# Patient Record
Sex: Female | Born: 1940 | Race: White | Hispanic: No
Health system: Southern US, Community
[De-identification: ages and names within clinical notes are randomized; demographics above are authoritative.]

---

## 1998-05-10 ENCOUNTER — Other Ambulatory Visit: Admission: RE | Admit: 1998-05-10 | Discharge: 1998-05-10 | Payer: Self-pay | Admitting: *Deleted

## 1999-05-30 ENCOUNTER — Other Ambulatory Visit: Admission: RE | Admit: 1999-05-30 | Discharge: 1999-05-30 | Payer: Self-pay | Admitting: *Deleted

## 2000-03-25 ENCOUNTER — Other Ambulatory Visit: Admission: RE | Admit: 2000-03-25 | Discharge: 2000-03-25 | Payer: Self-pay | Admitting: Family Medicine

## 2001-11-06 ENCOUNTER — Other Ambulatory Visit: Admission: RE | Admit: 2001-11-06 | Discharge: 2001-11-06 | Payer: Self-pay | Admitting: *Deleted

## 2002-12-30 ENCOUNTER — Other Ambulatory Visit: Admission: RE | Admit: 2002-12-30 | Discharge: 2002-12-30 | Payer: Self-pay | Admitting: *Deleted

## 2004-01-24 ENCOUNTER — Other Ambulatory Visit: Admission: RE | Admit: 2004-01-24 | Discharge: 2004-01-24 | Payer: Self-pay | Admitting: *Deleted

## 2005-03-08 ENCOUNTER — Other Ambulatory Visit: Admission: RE | Admit: 2005-03-08 | Discharge: 2005-03-08 | Payer: Self-pay | Admitting: *Deleted

## 2006-05-01 ENCOUNTER — Other Ambulatory Visit: Admission: RE | Admit: 2006-05-01 | Discharge: 2006-05-01 | Payer: Self-pay | Admitting: *Deleted

## 2007-07-23 ENCOUNTER — Other Ambulatory Visit: Admission: RE | Admit: 2007-07-23 | Discharge: 2007-07-23 | Payer: Self-pay | Admitting: *Deleted

## 2020-04-14 DIAGNOSIS — E039 Hypothyroidism, unspecified: Secondary | ICD-10-CM | POA: Diagnosis not present

## 2020-04-14 DIAGNOSIS — E871 Hypo-osmolality and hyponatremia: Secondary | ICD-10-CM | POA: Diagnosis not present

## 2020-04-14 DIAGNOSIS — R739 Hyperglycemia, unspecified: Secondary | ICD-10-CM | POA: Diagnosis not present

## 2020-04-14 DIAGNOSIS — E785 Hyperlipidemia, unspecified: Secondary | ICD-10-CM | POA: Diagnosis not present

## 2020-04-14 DIAGNOSIS — I10 Essential (primary) hypertension: Secondary | ICD-10-CM | POA: Diagnosis not present

## 2020-04-26 DIAGNOSIS — H7311 Chronic myringitis, right ear: Secondary | ICD-10-CM | POA: Diagnosis not present

## 2020-04-26 DIAGNOSIS — H6091 Unspecified otitis externa, right ear: Secondary | ICD-10-CM | POA: Diagnosis not present

## 2020-04-26 DIAGNOSIS — H6121 Impacted cerumen, right ear: Secondary | ICD-10-CM | POA: Diagnosis not present

## 2020-04-26 DIAGNOSIS — H9201 Otalgia, right ear: Secondary | ICD-10-CM | POA: Diagnosis not present

## 2020-05-03 DIAGNOSIS — R29898 Other symptoms and signs involving the musculoskeletal system: Secondary | ICD-10-CM | POA: Diagnosis not present

## 2020-05-03 DIAGNOSIS — I10 Essential (primary) hypertension: Secondary | ICD-10-CM | POA: Diagnosis not present

## 2020-05-03 DIAGNOSIS — E871 Hypo-osmolality and hyponatremia: Secondary | ICD-10-CM | POA: Diagnosis not present

## 2020-05-04 DIAGNOSIS — Z8669 Personal history of other diseases of the nervous system and sense organs: Secondary | ICD-10-CM | POA: Diagnosis not present

## 2020-05-04 DIAGNOSIS — H6091 Unspecified otitis externa, right ear: Secondary | ICD-10-CM | POA: Diagnosis not present

## 2020-05-23 DIAGNOSIS — I10 Essential (primary) hypertension: Secondary | ICD-10-CM | POA: Diagnosis not present

## 2020-05-23 DIAGNOSIS — E871 Hypo-osmolality and hyponatremia: Secondary | ICD-10-CM | POA: Diagnosis not present

## 2020-06-06 DIAGNOSIS — Z0000 Encounter for general adult medical examination without abnormal findings: Secondary | ICD-10-CM | POA: Diagnosis not present

## 2020-06-06 DIAGNOSIS — E785 Hyperlipidemia, unspecified: Secondary | ICD-10-CM | POA: Diagnosis not present

## 2020-06-06 DIAGNOSIS — Z139 Encounter for screening, unspecified: Secondary | ICD-10-CM | POA: Diagnosis not present

## 2020-07-04 DIAGNOSIS — I10 Essential (primary) hypertension: Secondary | ICD-10-CM | POA: Diagnosis not present

## 2020-08-15 DIAGNOSIS — E039 Hypothyroidism, unspecified: Secondary | ICD-10-CM | POA: Diagnosis not present

## 2020-08-15 DIAGNOSIS — I10 Essential (primary) hypertension: Secondary | ICD-10-CM | POA: Diagnosis not present

## 2020-08-15 DIAGNOSIS — E785 Hyperlipidemia, unspecified: Secondary | ICD-10-CM | POA: Diagnosis not present

## 2021-11-11 DIAGNOSIS — I517 Cardiomegaly: Secondary | ICD-10-CM | POA: Diagnosis not present

## 2021-11-12 DIAGNOSIS — I4891 Unspecified atrial fibrillation: Secondary | ICD-10-CM | POA: Diagnosis not present

## 2021-11-14 DIAGNOSIS — I480 Paroxysmal atrial fibrillation: Secondary | ICD-10-CM | POA: Insufficient documentation

## 2021-11-14 DIAGNOSIS — I63419 Cerebral infarction due to embolism of unspecified middle cerebral artery: Secondary | ICD-10-CM | POA: Insufficient documentation

## 2021-11-24 ENCOUNTER — Emergency Department (HOSPITAL_COMMUNITY): Payer: Medicare Other

## 2021-11-24 ENCOUNTER — Emergency Department (HOSPITAL_COMMUNITY)
Admission: EM | Admit: 2021-11-24 | Discharge: 2021-11-24 | Disposition: A | Discharge status: Skilled Nursing Facility | Payer: Medicare Other | Attending: Emergency Medicine | Admitting: Emergency Medicine

## 2021-11-24 DIAGNOSIS — N3090 Cystitis, unspecified without hematuria: Secondary | ICD-10-CM | POA: Diagnosis not present

## 2021-11-24 DIAGNOSIS — M542 Cervicalgia: Secondary | ICD-10-CM | POA: Insufficient documentation

## 2021-11-24 DIAGNOSIS — I10 Essential (primary) hypertension: Secondary | ICD-10-CM | POA: Diagnosis not present

## 2021-11-24 DIAGNOSIS — I4891 Unspecified atrial fibrillation: Secondary | ICD-10-CM | POA: Diagnosis not present

## 2021-11-24 DIAGNOSIS — E039 Hypothyroidism, unspecified: Secondary | ICD-10-CM | POA: Diagnosis not present

## 2021-11-24 DIAGNOSIS — I639 Cerebral infarction, unspecified: Secondary | ICD-10-CM | POA: Diagnosis not present

## 2021-11-24 DIAGNOSIS — Z20822 Contact with and (suspected) exposure to covid-19: Secondary | ICD-10-CM | POA: Insufficient documentation

## 2021-11-24 DIAGNOSIS — R4182 Altered mental status, unspecified: Secondary | ICD-10-CM | POA: Insufficient documentation

## 2021-11-24 LAB — CBC WITH DIFFERENTIAL/PLATELET
Abs Immature Granulocytes: 0.07 10*3/uL (ref 0.00–0.07)
Basophils Absolute: 0.1 10*3/uL (ref 0.0–0.1)
Basophils Relative: 0 %
Eosinophils Absolute: 0 10*3/uL (ref 0.0–0.5)
Eosinophils Relative: 0 %
HCT: 34.4 % — ABNORMAL LOW (ref 36.0–46.0)
Hemoglobin: 11.7 g/dL — ABNORMAL LOW (ref 12.0–15.0)
Immature Granulocytes: 1 %
Lymphocytes Relative: 11 %
Lymphs Abs: 1.6 10*3/uL (ref 0.7–4.0)
MCH: 33.5 pg (ref 26.0–34.0)
MCHC: 34 g/dL (ref 30.0–36.0)
MCV: 98.6 fL (ref 80.0–100.0)
Monocytes Absolute: 1.8 10*3/uL — ABNORMAL HIGH (ref 0.1–1.0)
Monocytes Relative: 12 %
Neutro Abs: 11.4 10*3/uL — ABNORMAL HIGH (ref 1.7–7.7)
Neutrophils Relative %: 76 %
Platelets: 442 10*3/uL — ABNORMAL HIGH (ref 150–400)
RBC: 3.49 MIL/uL — ABNORMAL LOW (ref 3.87–5.11)
RDW: 12.7 % (ref 11.5–15.5)
WBC: 15 10*3/uL — ABNORMAL HIGH (ref 4.0–10.5)
nRBC: 0 % (ref 0.0–0.2)

## 2021-11-24 LAB — RESP PANEL BY RT-PCR (FLU A&B, COVID) ARPGX2
Influenza A by PCR: NEGATIVE
Influenza B by PCR: NEGATIVE
SARS Coronavirus 2 by RT PCR: NEGATIVE

## 2021-11-24 LAB — COMPREHENSIVE METABOLIC PANEL
ALT: 16 U/L (ref 0–44)
AST: 18 U/L (ref 15–41)
Albumin: 2.8 g/dL — ABNORMAL LOW (ref 3.5–5.0)
Alkaline Phosphatase: 43 U/L (ref 38–126)
Anion gap: 8 (ref 5–15)
BUN: 6 mg/dL — ABNORMAL LOW (ref 8–23)
CO2: 23 mmol/L (ref 22–32)
Calcium: 8.5 mg/dL — ABNORMAL LOW (ref 8.9–10.3)
Chloride: 99 mmol/L (ref 98–111)
Creatinine, Ser: 0.7 mg/dL (ref 0.44–1.00)
GFR, Estimated: >60 mL/min (ref >60)
Glucose, Bld: 119 mg/dL — ABNORMAL HIGH (ref 70–99)
Potassium: 3.2 mmol/L — ABNORMAL LOW (ref 3.5–5.1)
Sodium: 130 mmol/L — ABNORMAL LOW (ref 135–145)
Total Bilirubin: 0.8 mg/dL (ref 0.3–1.2)
Total Protein: 6.6 g/dL (ref 6.5–8.1)

## 2021-11-24 LAB — PROTIME-INR
INR: 1.1 (ref 0.8–1.2)
Prothrombin Time: 13.9 seconds (ref 11.4–15.2)

## 2021-11-24 LAB — URINALYSIS, ROUTINE W REFLEX MICROSCOPIC
Bilirubin Urine: NEGATIVE
Glucose, UA: NEGATIVE mg/dL
Hgb urine dipstick: NEGATIVE
Ketones, ur: 5 mg/dL — AB
Nitrite: NEGATIVE
Protein, ur: NEGATIVE mg/dL
Specific Gravity, Urine: 1.015 (ref 1.005–1.030)
pH: 6 (ref 5.0–8.0)

## 2021-11-24 LAB — TSH: TSH: 2.768 u[IU]/mL (ref 0.350–4.500)

## 2021-11-24 MED ORDER — LACTATED RINGERS IV BOLUS: 1000.0000 mL | Freq: Once | INTRAVENOUS | Status: DC

## 2021-11-24 MED ORDER — SODIUM CHLORIDE 0.9 % IV SOLN
Freq: Once | INTRAVENOUS | Status: AC
Start: 2021-11-24 — End: 2021-11-24

## 2021-11-24 MED ORDER — ACETAMINOPHEN 325 MG PO TABS
650.0000 mg | ORAL_TABLET | Freq: Once | ORAL | Status: AC
  Administered 2021-11-24: 650 mg via ORAL
  Filled 2021-11-24: qty 2

## 2021-11-24 MED ORDER — CEFTRIAXONE SODIUM 1 G IJ SOLR
1.0000 g | Freq: Once | INTRAMUSCULAR | Status: AC
  Administered 2021-11-24: 1 g via INTRAVENOUS
  Filled 2021-11-24: qty 10

## 2021-11-24 MED ORDER — CEPHALEXIN 500 MG PO CAPS: 500.0000 mg | ORAL_CAPSULE | Freq: Two times a day (BID) | ORAL | 0 refills | Status: DC

## 2021-11-24 NOTE — ED Notes (Signed)
[facility] to be called for patient  

## 2021-11-24 NOTE — ED Notes (Addendum)
Patient transported to CT 

## 2021-11-24 NOTE — ED Provider Notes (Signed)
[facility] EMERGENCY DEPARTMENT Provider Note   CSN: [id number] Arrival date & time: 11/24/21  1613     History  No chief complaint on file.   Katherine Newman is a 81 y.o. female.  HPI This is a 81 year old female with a past medical history significant for hypertension, arthritis, hypothyroidism, atrial fibrillation, and ischemic stroke with hemorrhagic conversion who presents to emergency department for evaluation of altered mental status.  History is obtained from the patient as well as EMS and son via telephone.  Reports that the patient was brought to our emergency department from her skilled nursing facility.  There the patient has had a precipitous decline in her functional status.  They state that over the past two days the patient has become progressively more confused and has had difficulty with her ambulation specifically transitioning to her walker.  According to family members this is similar to her presentation where her initial ischemic stroke was found in december.  The patient has had no trauma and has had no infectious symptoms such as fever, chills, shortness of breath, chest pain, dysuria, vomiting, diarrhea, or new rashes.  The patient has had reported decreased p.o. intake.  The time of my evaluation the patient is alert and oriented to herself only.  Patient is aware that we are in the emergency department but is unsure which hospital and is unsure of the date.     Home Medications Prior to Admission medications   Medication Sig Start Date End Date Taking? Authorizing Provider  atorvastatin (LIPITOR) 80 MG tablet Take 80 mg by mouth daily.   Yes [provider]  cephALEXin (KEFLEX) 500 MG capsule Take 1 capsule (500 mg total) by mouth 2 (two) times daily. 11/24/21  Yes Zachery Dakins, MD  Cholecalciferol (VITAMIN D) 50 MCG (2000 UT) CAPS Take 2,000 Units by mouth daily.   Yes [provider]  Cyanocobalamin 2500 MCG SUBL  Place 2,500 mcg under the tongue daily.   Yes [provider]  diltiazem (CARDIZEM CD) 120 MG 24 hr capsule Take 120 mg by mouth daily.   Yes [provider]  lisinopril (ZESTRIL) 10 MG tablet Take 10 mg by mouth daily. 10/19/21  Yes [provider]  metoprolol tartrate (LOPRESSOR) 100 MG tablet Take 100 mg by mouth 2 (two) times daily.   Yes [provider]      Allergies    Patient has no known allergies.    Review of Systems   Review of Systems  All other systems reviewed and are negative.  Physical Exam Updated Vital Signs BP (!) 154/95    Pulse 84    Temp 99.2 F (37.3 C) (Oral)    Resp 16    SpO2 98%  Physical Exam Vitals and nursing note reviewed.  Constitutional:      General: She is not in acute distress.    Appearance: She is well-developed.     Comments: Patient resting in bed with neck tilted to the side. Patient states that she has pain to the right cervical paraspinal muscles.  Patient is able to range her neck.  HENT:     Head: Normocephalic and atraumatic.     Right Ear: External ear normal.     Left Ear: External ear normal.  Eyes:     Conjunctiva/sclera: Conjunctivae normal.  Cardiovascular:     Rate and Rhythm: Normal rate and regular rhythm.     Heart sounds: No murmur heard. Pulmonary:  Effort: Pulmonary effort is normal. No respiratory distress.     Breath sounds: Normal breath sounds.  Abdominal:     Palpations: Abdomen is soft.     Tenderness: There is no abdominal tenderness.  Musculoskeletal:        General: No swelling.     Cervical back: Neck supple. Tenderness present. No rigidity.  Skin:    General: Skin is warm and dry.     Capillary Refill: Capillary refill takes less than 2 seconds.  Neurological:     Mental Status: She is alert. Mental status is at baseline. She is disoriented.     Cranial Nerves: No cranial nerve deficit.     Sensory: No sensory deficit.     Motor: No weakness.  Psychiatric:         Mood and Affect: Mood normal.    ED Results / Procedures / Treatments   Labs (all labs ordered are listed, but only abnormal results are displayed) Labs Reviewed  CBC WITH DIFFERENTIAL/PLATELET - Abnormal; Notable for the following components:      Result Value   WBC 15.0 (*)    RBC 3.49 (*)    Hemoglobin 11.7 (*)    HCT 34.4 (*)    Platelets 442 (*)    Neutro Abs 11.4 (*)    Monocytes Absolute 1.8 (*)    All other components within normal limits  COMPREHENSIVE METABOLIC PANEL - Abnormal; Notable for the following components:   Sodium 130 (*)    Potassium 3.2 (*)    Glucose, Bld 119 (*)    BUN 6 (*)    Calcium 8.5 (*)    Albumin 2.8 (*)    All other components within normal limits  URINALYSIS, ROUTINE W REFLEX MICROSCOPIC - Abnormal; Notable for the following components:   APPearance HAZY (*)    Ketones, ur 5 (*)    Leukocytes,Ua MODERATE (*)    Bacteria, UA FEW (*)    All other components within normal limits  RESP PANEL BY RT-PCR (FLU A&B, COVID) ARPGX2  URINE CULTURE  TSH  PROTIME-INR    EKG EKG Interpretation  Date/Time:  Friday November 24 2021 17:08:11 EST Ventricular Rate:  85 PR Interval:    QRS Duration: 97 QT Interval:  386 QTC Calculation: 459 R Axis:   -28 Text Interpretation: Atrial fibrillation Abnormal R-wave progression, early transition LVH with secondary repolarization abnormality rate is slower compared to earlier in the day Confirmed by Sherwood Gambler [phone]) on 11/24/2021 9:19:25 PM  Radiology CT Head Wo Contrast  Result Date: 11/24/2021 CLINICAL DATA:  Mental status change, unknown cause hx of hemorrhagic stroke EXAM: CT HEAD WITHOUT CONTRAST TECHNIQUE: Contiguous axial images were obtained from the base of the skull through the vertex without intravenous contrast. RADIATION DOSE REDUCTION: This exam was performed according to the departmental dose-optimization program which includes automated exposure control, adjustment of the mA  and/or kV according to patient size and/or use of iterative reconstruction technique. COMPARISON:  11/14/2021 FINDINGS: Brain: Evolutionary changes in the previous we seen right basal ganglia infarction with hemorrhage. No new infarct or hemorrhage. No hydrocephalus. There is atrophy and chronic small vessel disease changes. Vascular: No hyperdense vessel or unexpected calcification. Skull: No acute calvarial abnormality. Sinuses/Orbits: No acute findings Other: None IMPRESSION: Evolutionary changes in the previously seen right basal ganglia infarct/hemorrhage. Atrophy, chronic microvascular disease. No acute intracranial abnormality. Electronically Signed   By: Rolm Baptise M.D.   On: 11/24/2021 19:02   DG Chest Portable 1  View  Result Date: 11/24/2021 CLINICAL DATA:  Concern for pneumonia EXAM: PORTABLE CHEST 1 VIEW COMPARISON:  11/11/2021 FINDINGS: Heart size and vascularity normal. Lungs are clear and free of infiltrate or effusion. Apical pleural scarring bilaterally. No acute skeletal abnormality. IMPRESSION: No active disease. Electronically Signed   By: Franchot Gallo M.D.   On: 11/24/2021 17:13    Procedures Procedures    Medications Ordered in ED Medications  cefTRIAXone (ROCEPHIN) 1 g in sodium chloride 0.9 % 100 mL IVPB (0 g Intravenous Stopped 11/24/21 2052)  0.9 %  sodium chloride infusion (0 mLs Intravenous Stopped 11/24/21 2117)  acetaminophen (TYLENOL) tablet 650 mg (650 mg Oral Given 11/24/21 2020)    ED Course/ Medical Decision Making/ A&P                           Medical Decision Making Amount and/or Complexity of Data Reviewed Labs: ordered. Radiology: ordered.  Risk OTC drugs. Prescription drug management.   This is a 81 year old female who presents to emergency department for evaluation of altered mental status.  The patient is at risk for acute life-threatening illness in the form of hemorrhagic stroke and her recent hemorrhagic stroke.  Differential diagnosis  includes but is not limited to the following: Hemorrhagic stroke, ischemic stroke, electrolyte abnormality, hypothyroidism, urinary tract infection, pneumonia, viral infection.  CT head was obtained at arrival and reviewed independently.  I agree with the radiology interpretation that her hemorrhagic conversion is stable.  Lab work-up was obtained which did reveal a leukocytosis of 15.0 with urine concerning for cystitis.  The patient also had a sodium of 130 and potassium 3.2 creatinine was at baseline.  The patient was not overtly febrile while in the emergency department however her rectal temp was greater than 100.  I suspect that the patient's encephalopathy is secondary to urinary tract infection.  We will administer 1 L of fluids and 1 g of Rocephin here in the emergency department we will also obtain urine culture and discharge the patient home on Keflex.  This plan was discussed with the patient's son over the telephone and I was able to tell him the patient's exam at the time of our conversation.  He agreed that he felt to being discharged to her nursing facility was a reasonable plan.  Also gave return precautions that she should return if she did not have improvement of her altered mental status in the next few days.  Final Clinical Impression(s) / ED Diagnoses Final diagnoses:  Altered mental status, unspecified altered mental status type  Cystitis    Rx / DC Orders ED Discharge Orders          Ordered    cephALEXin (KEFLEX) 500 MG capsule  2 times daily        11/24/21 2127              Zachery Dakins, MD 11/24/21 2154    Sherwood Gambler, MD 11/27/21 [phone]

## 2021-11-24 NOTE — ED Notes (Signed)
Report given to [facility] nursing facility

## 2021-11-24 NOTE — Discharge Instructions (Signed)
Ms Rosier was seen and evaluated in our emergency department for altered mental status.  Imaging of her head was unremarkable.  Lab work-up was significant for findings consistent with urinary tract infection.  Patient was discharged home on course of Keflex.  Recommend follow-up with PCP for monitoring of resolution of altered abscess.  Care discussed with the patient's son.

## 2021-11-24 NOTE — ED Triage Notes (Signed)
Pt bib ems from [facility] facility c/o AMS. Pt had CVA with hemorraghic bleed 11/11/2021 at [facility] with no deficits. Pt admitted to [facility] facility 11/18/2021 and staff noticed a decline in mental status 11/20/2021. While son was visiting pt 11/23/2021, pt was cognitively different at 11:00.  Pt was taken off blood thinners. Pt had a CT scan scheduled 11/29/2021 to determine if pt was still bleeding.   Pt currently expressing neck pain upon moving.  BP 160/98 HR 88 (A-fib) CBG 122 RR 16 RA 98%

## 2021-11-24 NOTE — ED Notes (Signed)
Patient transported to CT 

## 2021-11-24 NOTE — ED Notes (Signed)
Pt discharged and taken back to facility via [facility].

## 2021-11-27 LAB — URINE CULTURE: Culture: >=100000 — AB

## 2021-11-28 ENCOUNTER — Telehealth: Payer: Self-pay

## 2021-11-28 DIAGNOSIS — F0150 Vascular dementia without behavioral disturbance: Secondary | ICD-10-CM | POA: Insufficient documentation

## 2021-11-28 NOTE — Telephone Encounter (Signed)
Post ED Visit - Positive Culture Follow-up  Culture report reviewed by antimicrobial stewardship pharmacist: [facility] []  , Pharm.D. [x]  Enzo Bi, Pharm.D., BCPS AQ-ID []  , Pharm.D., BCPS []  Celedonio Miyamoto, Pharm.D., BCPS []  [place]ler, Garvin Fila.D., BCPS, AAHIVP []  , Pharm.D., BCPS, AAHIVP []  Georgina Pillion, PharmD, BCPS []  , PharmD, BCPS []  [place], PharmD, BCPS []  [address], PharmD []  , PharmD, BCPS []  Estella Husk, PharmD  Pharmacy Team []  Lysle Pearl, PharmD []  , PharmD []  Phillips Climes, PharmD []  , Rph []  Agapito Games) , PharmD []  Verlan Friends, PharmD []  , PharmD []  Mervyn Gay, PharmD []  , PharmD []  Vinnie Level, PharmD []  [facility], PharmD []  , PharmD []  Len Childs, PharmD   Positive urine culture Treated with Cephalexin, organism sensitive to the same and no further patient follow-up is required at this time.  11/28/2021, 3:09 PM

## 2021-12-11 ENCOUNTER — Ambulatory Visit: Payer: Medicare Other | Admitting: Neurology

## 2021-12-11 VITALS — BP 118/71 | HR 73

## 2021-12-11 DIAGNOSIS — I6381 Other cerebral infarction due to occlusion or stenosis of small artery: Secondary | ICD-10-CM

## 2021-12-11 DIAGNOSIS — I4819 Other persistent atrial fibrillation: Secondary | ICD-10-CM | POA: Diagnosis not present

## 2021-12-11 DIAGNOSIS — R413 Other amnesia: Secondary | ICD-10-CM | POA: Diagnosis not present

## 2021-12-11 DIAGNOSIS — F01A0 Vascular dementia, mild, without behavioral disturbance, psychotic disturbance, mood disturbance, and anxiety: Secondary | ICD-10-CM

## 2021-12-11 DIAGNOSIS — F01A18 Vascular dementia, mild, with other behavioral disturbance: Secondary | ICD-10-CM | POA: Diagnosis not present

## 2021-12-11 DIAGNOSIS — I480 Paroxysmal atrial fibrillation: Secondary | ICD-10-CM

## 2021-12-11 DIAGNOSIS — I63419 Cerebral infarction due to embolism of unspecified middle cerebral artery: Secondary | ICD-10-CM

## 2021-12-11 MED ORDER — DONEPEZIL HCL 5 MG PO TABS
5.0000 mg | ORAL_TABLET | Freq: Every day | ORAL | 3 refills | Status: AC
Start: 2021-12-11 — End: ?

## 2021-12-11 MED ORDER — APIXABAN 5 MG PO TABS
5.0000 mg | ORAL_TABLET | Freq: Two times a day (BID) | ORAL | 3 refills | Status: AC
Start: 2021-12-11 — End: ?

## 2021-12-11 NOTE — Patient Instructions (Signed)
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  She is also been diagnosed with new onset A-fib and so recommend she start taking Eliquis 5 mg twice daily for stroke prophylaxis as well as maintain aggressive risk factor modification with strict control of hypertension with blood pressure goal below 130/90, lipids with LDL cholesterol goal below 70 mg percent and diabetes with hemoglobin A1c goal below 6.5%.  We will also check dementia panel labs and EEG.  I encouraged her to increase participation in cognitively challenging activities like solving crossword puzzles, playing bridge and sudoku.  I also advised her to use her wheeled walker at all times while walking outdoors and we discussed fall and safety precautions.  She will return for follow-up in the future in 3 months or call earlier if necessary. Fall Prevention in the Home, Adult Falls can cause injuries and can happen to people of all ages. There are many things you can do to make your home safe and to help prevent falls. Ask for help when making these changes. What actions can I take to prevent falls? General Instructions Use good lighting in all rooms. Replace any light bulbs that burn out. Turn on the lights in dark areas. Use night-lights. Keep items that you use often in easy-to-reach places. Lower the shelves around your home if needed. Set up your furniture so you have a clear path. Avoid moving your furniture around. Do not have throw rugs or other things on the floor that can make you trip. Avoid walking on wet floors. If any of your floors are uneven, fix them. Add color or contrast paint or tape to clearly mark and help you see: Grab bars or handrails. First and last steps of staircases. Where the edge of each step is. If you use a stepladder: Make sure that it is fully opened. Do  not climb a closed stepladder. Make sure the sides of the stepladder are locked in place. Ask someone to hold the stepladder while you use it. Know where your pets are when moving through your home. What can I do in the bathroom?   Keep the floor dry. Clean up any water on the floor right away. Remove soap buildup in the tub or shower. Use nonskid mats or decals on the floor of the tub or shower. Attach bath mats securely with double-sided, nonslip rug tape. If you need to sit down in the shower, use a plastic, nonslip stool. Install grab bars by the toilet and in the tub and shower. Do not use towel bars as grab bars. What can I do in the bedroom? Make sure that you have a light by your bed that is easy to reach. Do not use any sheets or blankets for your bed that hang to the floor. Have a firm chair with side arms that you can use for support when you get dressed. What can I do in the kitchen? Clean up any spills right away. If you need to reach something above you, use a step stool with a grab bar. Keep electrical cords out of the way. Do not use floor polish or wax that makes floors slippery. What can I do with my stairs? Do not leave any items on the stairs. Make sure that you have a light switch at the top and the bottom of the stairs. Make sure that there are  handrails on both sides of the stairs. Fix handrails that are broken or loose. Install nonslip stair treads on all your stairs. Avoid having throw rugs at the top or bottom of the stairs. Choose a carpet that does not hide the edge of the steps on the stairs. Check carpeting to make sure that it is firmly attached to the stairs. Fix carpet that is loose or worn. What can I do on the outside of my home? Use bright outdoor lighting. Fix the edges of walkways and driveways and fix any cracks. Remove anything that might make you trip as you walk through a door, such as a raised step or threshold. Trim any bushes or trees on  paths to your home. Check to see if handrails are loose or broken and that both sides of all steps have handrails. Install guardrails along the edges of any raised decks and porches. Clear paths of anything that can make you trip, such as tools or rocks. Have leaves, snow, or ice cleared regularly. Use sand or salt on paths during winter. Clean up any spills in your garage right away. This includes grease or oil spills. What other actions can I take? Wear shoes that: Have a low heel. Do not wear high heels. Have rubber bottoms. Feel good on your feet and fit well. Are closed at the toe. Do not wear open-toe sandals. Use tools that help you move around if needed. These include: Canes. Walkers. Scooters. Crutches. Review your medicines with your doctor. Some medicines can make you feel dizzy. This can increase your chance of falling. Ask your doctor what else you can do to help prevent falls. Where to find more information Centers for Disease Control and Prevention, [employer]: FootballExhibition.com.br [employer] on Aging: https://walker.com/ Contact a doctor if: You are afraid of falling at home. You feel weak, drowsy, or dizzy at home. You fall at home. Summary There are many simple things that you can do to make your home safe and to help prevent falls. Ways to make your home safe include removing things that can make you trip and installing grab bars in the bathroom. Ask for help when making these changes in your home. This information is not intended to replace advice given to you by your health care provider. Make sure you discuss any questions you have with your health care provider. Document Revised: 05/25/2020 Document Reviewed: 05/25/2020 Elsevier Patient Education  2022 [employer].

## 2021-12-11 NOTE — Progress Notes (Signed)
[facility] [address] [place] [address] [phone]       OFFICE CONSULT NOTE  Ms. LOMETA HARA Date of Birth:  [birth year] Medical Record Number:  [id number]   Referring MD: Josetta Huddle  Reason for Referral: Stroke and dementia  HPI: Ms. Mortenson is a 81 year old Caucasian lady seen today for initial office consultation visit.  She is accompanied by her son who provides most of the history.  We also reviewed discharge summary from [facility] as well as electronic medical records and pertinent available imaging films in PACS.  Patient was apparently found confused and disoriented on Christmas morning by the son.  Initially thought that she was really dehydrated as she exhibited similar behavior in the past.  The couple of days when she is not getting better they called EMS and she was taken to [facility] and admitted.  CT scan on admission showed a right basal ganglia hypodensity and subsequently MRI scan on 11/13/2021 showed a large moderate MCA infarct with petechial hemorrhagic transformation.  Patient was found to be in new onset atrial fibrillation during this hospitalization with rapid ventricular rate.  Echocardiogram showed normal ejection fraction of 50 to 55% without definite clot hemoglobin A1c and lipid profile results are not available.   She was seen by cardiology and started on Cardizem for rate control.  Aspirin and anticoagulation were held due to hemorrhagic transformation.  Patient was discharged to inpatient rehab .Patient subsequently had an visit to the ER on 11/24/2021 for altered mental status.  At that time follow-up CT scan showed expected evolutionary changes in the right basal ganglia infarct which no longer of fresh blood seen.  She was found to have urinary tract infection and was treated with Keflex which she had just finished.  [facility] but has recently been discharged home for the last few days.   Patient is still not any on any antiplatelet agent or anticonvulsant.  Patient has been cognitively altered and having significant memory and cognitive difficulties for the last 1 month while she was at Nursing home.  Son requested this visit for neurological evaluation to see if she had any treatment options to improve her condition.  Patient has not had any other definite weakness strokes in the form of focal weakness gait or balance difficulties.  She is currently getting physical therapy and is able to ambulate with a wheeled walker.  She had frequent falls in the past but son felt that was related to dehydration and electrolyte imbalance.  On Mini-Mental status exam today she scored 21/30 and was able to name only 2 animals which can walk on 4 legs..  The last few days of the patient has been at home with her son being the sole caregiver he has noticed that she has some delusions and hallucinations and sees people and objects which are not there.  She does not however get agitated or violent.  She has not exhibited any unsafe behavior.  ROS:   14 system review of systems is positive for memory loss, impaired cognition, confusion, disorientation, generalized weakness, difficulty walking all other systems negative  PMH: No past medical history on file.  Social History:  Social History   Socioeconomic History   Marital status: Widowed    Spouse name: Not on file   Number of children: Not on file   Years of education: Not on file   Highest education level: Not on file  Occupational History  Not on file  Tobacco Use   Smoking status: Not on file   Smokeless tobacco: Not on file  Substance and Sexual Activity   Alcohol use: Not on file   Drug use: Not on file   Sexual activity: Not on file  Other Topics Concern   Not on file  Social History Narrative   Not on file   Social Determinants of Health   Financial Resource Strain: Not on file  Food Insecurity: Not on file  Transportation  Needs: Not on file  Physical Activity: Not on file  Stress: Not on file  Social Connections: Not on file  Intimate Partner Violence: Not on file    Medications:   Current Outpatient Medications on File Prior to Visit  Medication Sig Dispense Refill   atorvastatin (LIPITOR) 80 MG tablet Take 80 mg by mouth daily.     Cholecalciferol (VITAMIN D) 50 MCG (2000 UT) CAPS Take 2,000 Units by mouth daily.     Cyanocobalamin 2500 MCG SUBL Place 2,500 mcg under the tongue daily.     diltiazem (CARDIZEM CD) 120 MG 24 hr capsule Take 120 mg by mouth daily.     lisinopril (ZESTRIL) 10 MG tablet Take 10 mg by mouth daily.     metoprolol tartrate (LOPRESSOR) 100 MG tablet Take 100 mg by mouth 2 (two) times daily.     No current facility-administered medications on file prior to visit.    Allergies:  No Known Allergies  Physical Exam General: Frail malnourished looking elderly Caucasian lady seated, in no evident distress Head: head normocephalic and atraumatic.   Neck: supple with no carotid or supraclavicular bruits Cardiovascular: regular rate and rhythm, no murmurs Musculoskeletal: Marked kyphoscoliosis.  Skin:  no rash/petichiae Vascular:  Normal pulses all extremities  Neurologic Exam Mental Status: Awake and fully alert. Oriented to place and time. Recent and remote memory  t. Attention span, concentration and fund of knowledge appropriate. Mood and affect appropriate.  Cranial Nerves: Fundoscopic exam reveals sharp disc margins. Pupils equal, briskly reactive to light. Extraocular movements full without nystagmus. Visual fields full to confrontation. Hearing intact. Facial sensation intact. Face, tongue, palate moves normally and symmetrically.  Motor: Normal bulk and tone.  Grade 4 strength in all tested extremity muscles. Sensory.: intact to touch , pinprick , position and vibratory sensation.  Coordination: Rapid alternating movements normal in all extremities. Finger-to-nose and  heel-to-shin performed accurately bilaterally. Gait and Station: Arises from chair with slight difficulty. Stance is stooped uses a wheeled walker.   Reflexes: 1+ and symmetric. Toes downgoing.   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  Vascular risk factors of new onset atrial fibrillation, hyperlipidemia and hypertension     PLAN: I had a long discussion with the patient and her son regarding recent right basal ganglia infarct in December 2023 resultant cognitive deterioration due to mixed Alzheimer's and vascular dementia.  She is also been diagnosed  with new onset A-fib and so recommend she start taking Eliquis 5 mg twice daily for stroke prophylaxis as well as maintain aggressive risk factor modification with strict control of hypertension with blood pressure goal below 130/90, lipids with LDL cholesterol goal below 70 mg percent and diabetes with hemoglobin A1c goal below 6.5%.  We will also check dementia panel labs and EEG.  I encouraged her to increase participation in cognitively challenging activities like solving crossword puzzles, playing bridge and sudoku.  I also advised her to use her wheeled walker at all times while walking outdoors and we discussed fall and safety precautions.  She will return for follow-up in the future in 3 months or call earlier if necessary.  Greater than 50% time during this prolonged 60-minute consultation visit was  spent on counseling and coordination of care about her embolic stroke, risk benefit of anticoagulation and cognitive impairment and vascular dementia and discussion about treatment options and answering questions Antony Contras, MD  Note: This document was prepared with digital dictation and possible smart phrase technology. Any transcriptional errors that result from this process are unintentional.

## 2021-12-12 ENCOUNTER — Telehealth: Payer: Self-pay | Admitting: *Deleted

## 2021-12-12 LAB — DEMENTIA PANEL
Homocysteine: 8 umol/L (ref 0.0–19.2)
RPR Ser Ql: NONREACTIVE
TSH: 4.67 u[IU]/mL — ABNORMAL HIGH (ref 0.450–4.500)
Vitamin B-12: >2000 pg/mL — ABNORMAL HIGH (ref 232–1245)

## 2021-12-12 NOTE — Telephone Encounter (Signed)
-----   Message from Micki Riley, MD sent at 12/12/2021  3:16 PM EST ----- Kindly inform the patient that vitamin B12 is normal.  Test for syphilis is negative.  Thyroid-stimulating hormone is slightly elevated and to see primary care physician for medication adjustments for this

## 2021-12-12 NOTE — Progress Notes (Signed)
Kindly inform the patient that vitamin B12 is normal.  Test for syphilis is negative.  Thyroid-stimulating hormone is slightly elevated and to see primary care physician for medication adjustments for this

## 2021-12-14 NOTE — Telephone Encounter (Addendum)
I spoke to the patient's son on [place]. He is aware of the lab results and will have her follow up with PCP.  He wanted the labs faxed to Gaye Alken, NP. Printed, faxed and confirmed to [phone].

## 2021-12-14 NOTE — Telephone Encounter (Signed)
Attempted to call pt, LVM for call back  °

## 2021-12-19 ENCOUNTER — Other Ambulatory Visit: Payer: Medicare Other | Admitting: *Deleted

## 2021-12-20 ENCOUNTER — Ambulatory Visit: Payer: Medicare Other | Admitting: Neurology

## 2021-12-20 ENCOUNTER — Other Ambulatory Visit: Payer: Self-pay

## 2021-12-20 DIAGNOSIS — R413 Other amnesia: Secondary | ICD-10-CM

## 2021-12-20 DIAGNOSIS — F01A18 Vascular dementia, mild, with other behavioral disturbance: Secondary | ICD-10-CM

## 2021-12-20 DIAGNOSIS — R410 Disorientation, unspecified: Secondary | ICD-10-CM

## 2022-01-01 ENCOUNTER — Telehealth: Payer: Self-pay | Admitting: *Deleted

## 2022-01-01 NOTE — Telephone Encounter (Signed)
Attempted to call pt, LVM for normal results per DPR. Ask pt to call back for questions or concerns.  

## 2022-01-01 NOTE — Telephone Encounter (Signed)
-----   Message from Ocie Doyne, MD sent at 01/01/2022  1:04 PM EST ----- EEG is normal

## 2022-02-05 ENCOUNTER — Ambulatory Visit: Payer: Medicare Other | Admitting: Neurology

## 2022-06-08 DIAGNOSIS — I6930 Unspecified sequelae of cerebral infarction: Secondary | ICD-10-CM | POA: Diagnosis not present

## 2022-06-08 DIAGNOSIS — I4891 Unspecified atrial fibrillation: Secondary | ICD-10-CM | POA: Diagnosis not present

## 2022-06-08 DIAGNOSIS — I10 Essential (primary) hypertension: Secondary | ICD-10-CM | POA: Diagnosis not present

## 2022-06-08 DIAGNOSIS — E785 Hyperlipidemia, unspecified: Secondary | ICD-10-CM | POA: Diagnosis not present

## 2022-06-08 DIAGNOSIS — R634 Abnormal weight loss: Secondary | ICD-10-CM | POA: Diagnosis not present

## 2022-06-15 DIAGNOSIS — Z139 Encounter for screening, unspecified: Secondary | ICD-10-CM | POA: Diagnosis not present

## 2022-06-15 DIAGNOSIS — Z0000 Encounter for general adult medical examination without abnormal findings: Secondary | ICD-10-CM | POA: Diagnosis not present

## 2022-06-15 DIAGNOSIS — E785 Hyperlipidemia, unspecified: Secondary | ICD-10-CM | POA: Diagnosis not present

## 2022-06-15 DIAGNOSIS — Z9181 History of falling: Secondary | ICD-10-CM | POA: Diagnosis not present

## 2022-07-23 DIAGNOSIS — R634 Abnormal weight loss: Secondary | ICD-10-CM | POA: Diagnosis not present

## 2022-09-20 DIAGNOSIS — Z23 Encounter for immunization: Secondary | ICD-10-CM | POA: Diagnosis not present

## 2022-09-20 DIAGNOSIS — I4891 Unspecified atrial fibrillation: Secondary | ICD-10-CM | POA: Diagnosis not present

## 2022-09-20 DIAGNOSIS — R634 Abnormal weight loss: Secondary | ICD-10-CM | POA: Diagnosis not present

## 2022-09-20 DIAGNOSIS — I10 Essential (primary) hypertension: Secondary | ICD-10-CM | POA: Diagnosis not present

## 2022-12-21 DIAGNOSIS — E039 Hypothyroidism, unspecified: Secondary | ICD-10-CM | POA: Diagnosis not present

## 2022-12-21 DIAGNOSIS — E785 Hyperlipidemia, unspecified: Secondary | ICD-10-CM | POA: Diagnosis not present

## 2022-12-21 DIAGNOSIS — R634 Abnormal weight loss: Secondary | ICD-10-CM | POA: Diagnosis not present

## 2022-12-21 DIAGNOSIS — I10 Essential (primary) hypertension: Secondary | ICD-10-CM | POA: Diagnosis not present

## 2022-12-21 DIAGNOSIS — I4891 Unspecified atrial fibrillation: Secondary | ICD-10-CM | POA: Diagnosis not present

## 2022-12-21 DIAGNOSIS — I6930 Unspecified sequelae of cerebral infarction: Secondary | ICD-10-CM | POA: Diagnosis not present

## 2023-01-31 DIAGNOSIS — Z961 Presence of intraocular lens: Secondary | ICD-10-CM | POA: Diagnosis not present

## 2023-04-08 DIAGNOSIS — R634 Abnormal weight loss: Secondary | ICD-10-CM | POA: Diagnosis not present

## 2023-04-08 DIAGNOSIS — E785 Hyperlipidemia, unspecified: Secondary | ICD-10-CM | POA: Diagnosis not present

## 2023-04-08 DIAGNOSIS — I4891 Unspecified atrial fibrillation: Secondary | ICD-10-CM | POA: Diagnosis not present

## 2023-04-08 DIAGNOSIS — I10 Essential (primary) hypertension: Secondary | ICD-10-CM | POA: Diagnosis not present

## 2023-04-08 DIAGNOSIS — I6930 Unspecified sequelae of cerebral infarction: Secondary | ICD-10-CM | POA: Diagnosis not present

## 2023-07-09 DIAGNOSIS — Z0000 Encounter for general adult medical examination without abnormal findings: Secondary | ICD-10-CM | POA: Diagnosis not present

## 2023-07-09 DIAGNOSIS — Z9181 History of falling: Secondary | ICD-10-CM | POA: Diagnosis not present

## 2023-07-23 DIAGNOSIS — R634 Abnormal weight loss: Secondary | ICD-10-CM | POA: Diagnosis not present

## 2023-07-23 DIAGNOSIS — E039 Hypothyroidism, unspecified: Secondary | ICD-10-CM | POA: Diagnosis not present

## 2023-07-23 DIAGNOSIS — I10 Essential (primary) hypertension: Secondary | ICD-10-CM | POA: Diagnosis not present

## 2023-07-23 DIAGNOSIS — I4891 Unspecified atrial fibrillation: Secondary | ICD-10-CM | POA: Diagnosis not present

## 2023-07-23 DIAGNOSIS — Z6820 Body mass index (BMI) 20.0-20.9, adult: Secondary | ICD-10-CM | POA: Diagnosis not present

## 2023-07-23 DIAGNOSIS — I6930 Unspecified sequelae of cerebral infarction: Secondary | ICD-10-CM | POA: Diagnosis not present

## 2023-07-23 DIAGNOSIS — E785 Hyperlipidemia, unspecified: Secondary | ICD-10-CM | POA: Diagnosis not present

## 2023-10-22 DIAGNOSIS — Z23 Encounter for immunization: Secondary | ICD-10-CM | POA: Diagnosis not present

## 2023-10-22 DIAGNOSIS — M810 Age-related osteoporosis without current pathological fracture: Secondary | ICD-10-CM | POA: Diagnosis not present

## 2023-10-22 DIAGNOSIS — I6930 Unspecified sequelae of cerebral infarction: Secondary | ICD-10-CM | POA: Diagnosis not present

## 2023-10-22 DIAGNOSIS — I10 Essential (primary) hypertension: Secondary | ICD-10-CM | POA: Diagnosis not present

## 2023-10-22 DIAGNOSIS — I4891 Unspecified atrial fibrillation: Secondary | ICD-10-CM | POA: Diagnosis not present

## 2023-10-22 DIAGNOSIS — Z6820 Body mass index (BMI) 20.0-20.9, adult: Secondary | ICD-10-CM | POA: Diagnosis not present

## 2023-10-22 DIAGNOSIS — E039 Hypothyroidism, unspecified: Secondary | ICD-10-CM | POA: Diagnosis not present

## 2023-10-22 DIAGNOSIS — R634 Abnormal weight loss: Secondary | ICD-10-CM | POA: Diagnosis not present

## 2023-10-22 DIAGNOSIS — R040 Epistaxis: Secondary | ICD-10-CM | POA: Diagnosis not present

## 2023-10-22 DIAGNOSIS — M8589 Other specified disorders of bone density and structure, multiple sites: Secondary | ICD-10-CM | POA: Diagnosis not present

## 2023-10-22 DIAGNOSIS — E785 Hyperlipidemia, unspecified: Secondary | ICD-10-CM | POA: Diagnosis not present

## 2023-11-14 IMAGING — CT CT HEAD W/O CM
3 series · 16 of 47 positions shown, 19 images · non-contrast
Comparison: 11/14/2021

CLINICAL DATA: Mental status change, unknown cause hx of
hemorrhagic stroke



[Series 4: head 5.0 h30s · axial · 0.41mm/px · z∈[+908,+1048]mm · 10 of 34 slices shown, 13 images]
[im 3/34  brain]
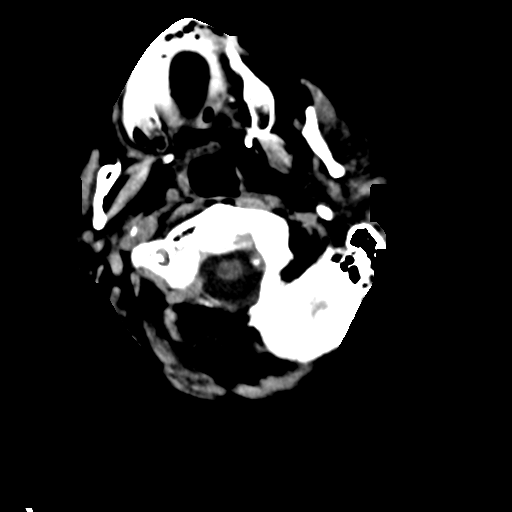
[im 3/34  bone]
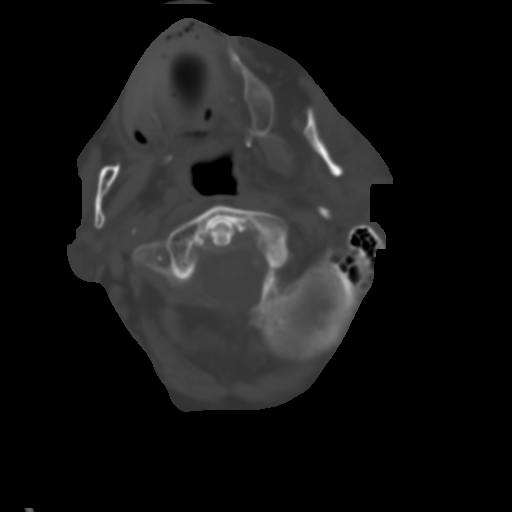
[im 6/34  brain]
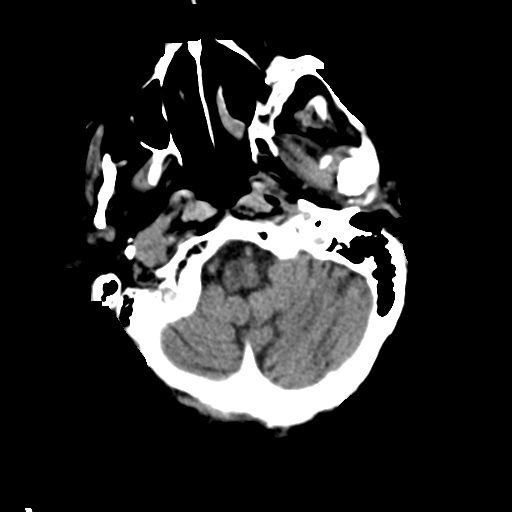
[im 10/34  brain]
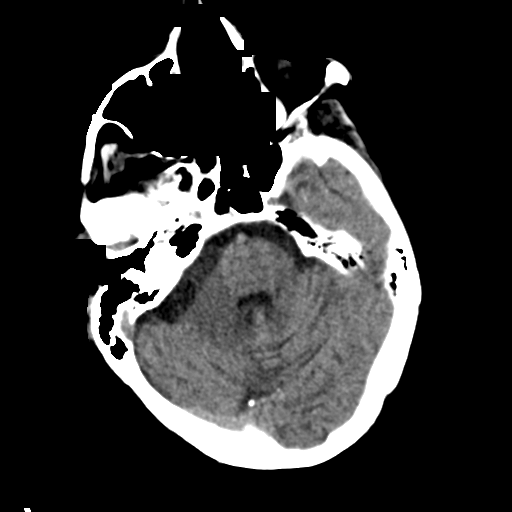
[im 12/34  brain]
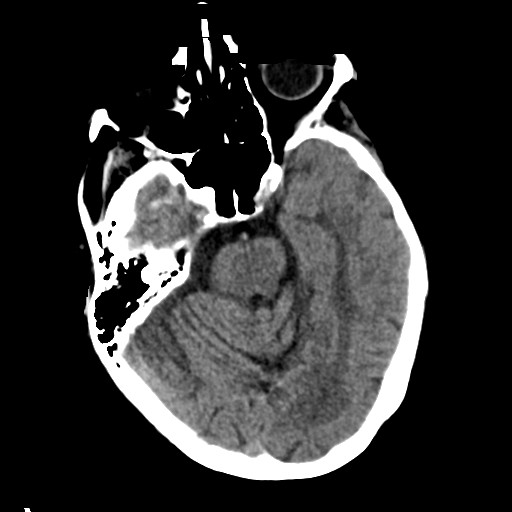
[im 15/34  brain]
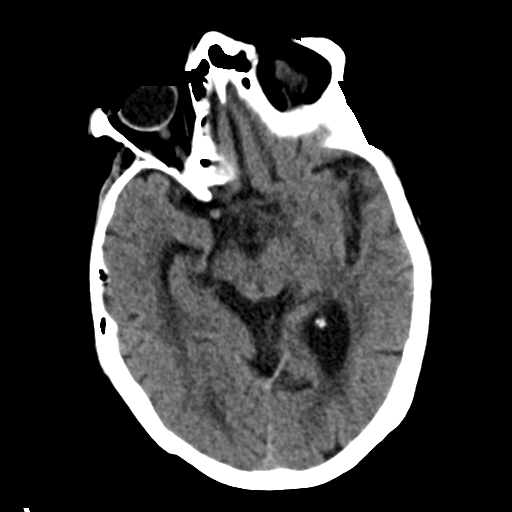
[im 15/34  bone]
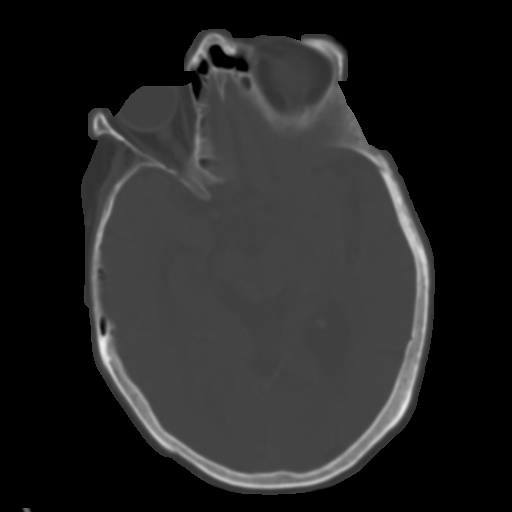
[im 19/34  brain]
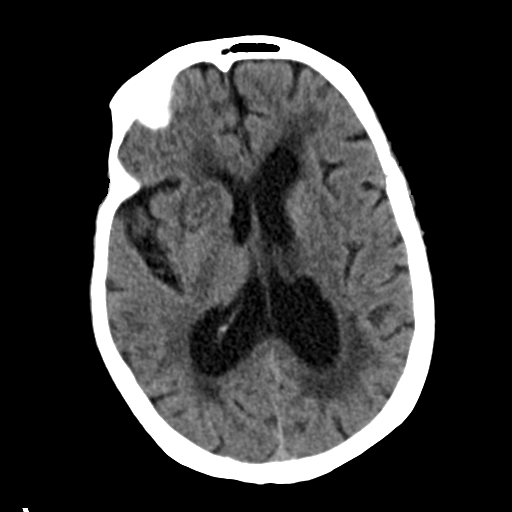
[im 22/34  brain]
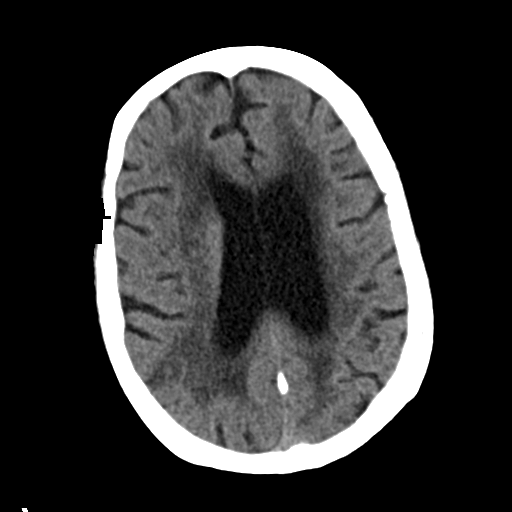
[im 26/34  brain]
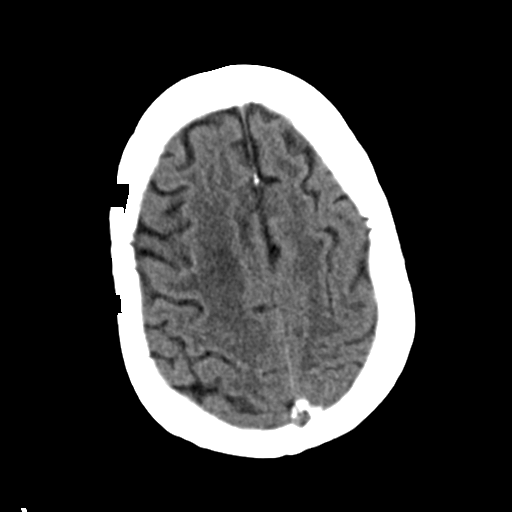
[im 28/34  brain]
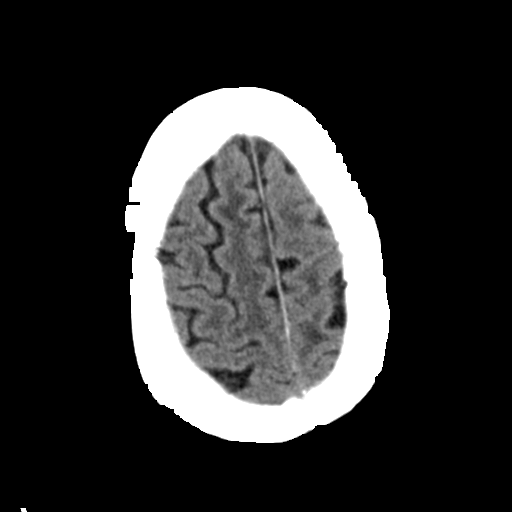
[im 28/34  bone]
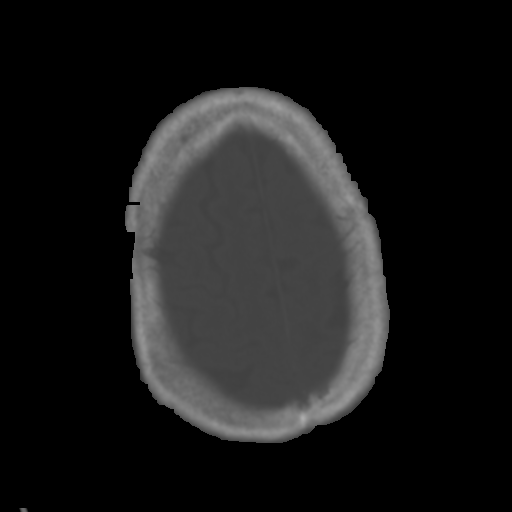
[im 31/34  brain]
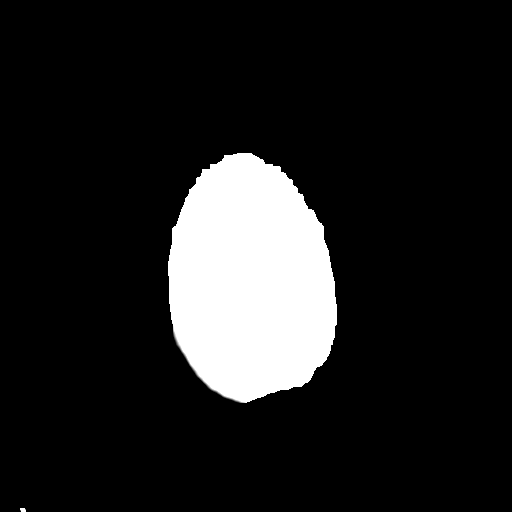

[Series 5: head 3.0 mpr cor · coronal · 0.32mm/px · 3 of 66 slices shown]
[im 22/66  brain]
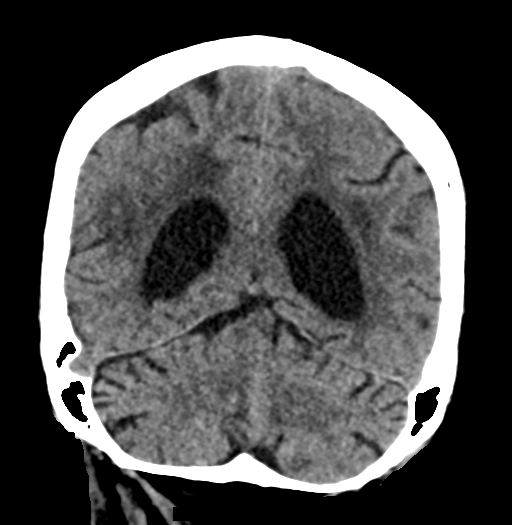
[im 29/66  brain]
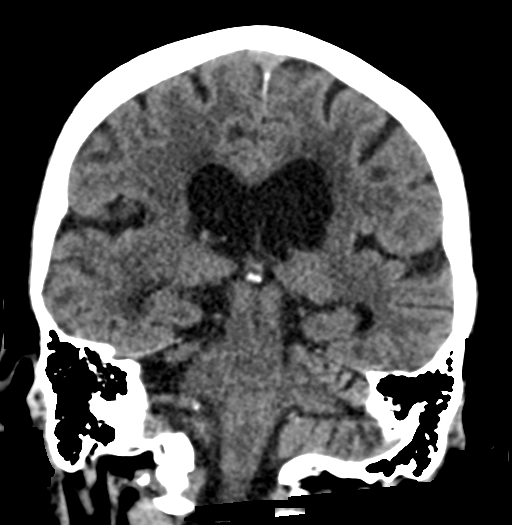
[im 37/66  brain]
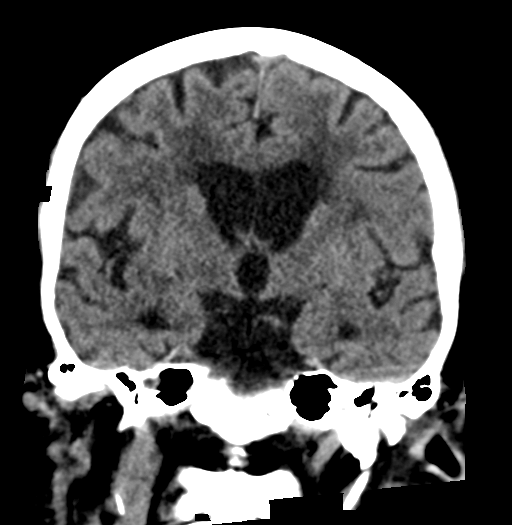

[Series 6: head 3.0 mpr sag · sagittal · 0.33mm/px · 3 of 54 slices shown]
[im 18/54  brain]
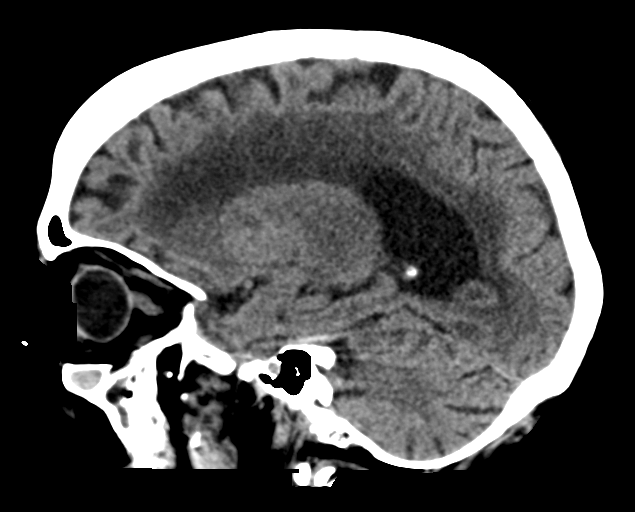
[im 27/54  brain]
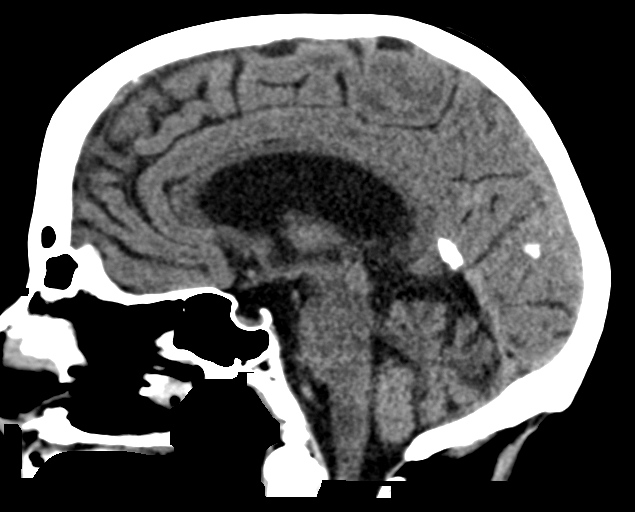
[im 36/54  brain]
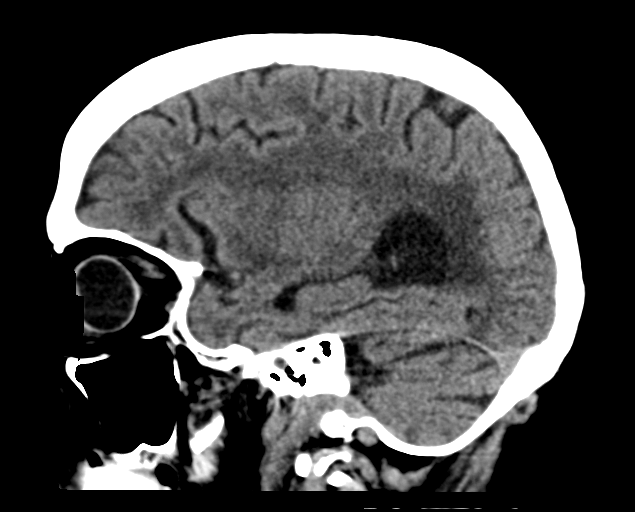

[16 of 47 positions shown; findings below may reference images not displayed]

FINDINGS: Brain: Evolutionary changes in the previous we seen right basal
ganglia infarction with hemorrhage. No new infarct or hemorrhage. No
hydrocephalus. There is atrophy and chronic small vessel disease
changes.

Vascular: No hyperdense vessel or unexpected calcification.

Skull: No acute calvarial abnormality.

Sinuses/Orbits: No acute findings

Other: None
IMPRESSION: Evolutionary changes in the previously seen right basal ganglia
infarct/hemorrhage.

Atrophy, chronic microvascular disease.

No acute intracranial abnormality.

## 2023-11-14 IMAGING — DX DG CHEST 1V PORT
1 series · 1 of 1 positions shown · non-contrast
Comparison: 11/11/2021

CLINICAL DATA: Concern for pneumonia

EXAM:
PORTABLE CHEST 1 VIEW

[chest ap]
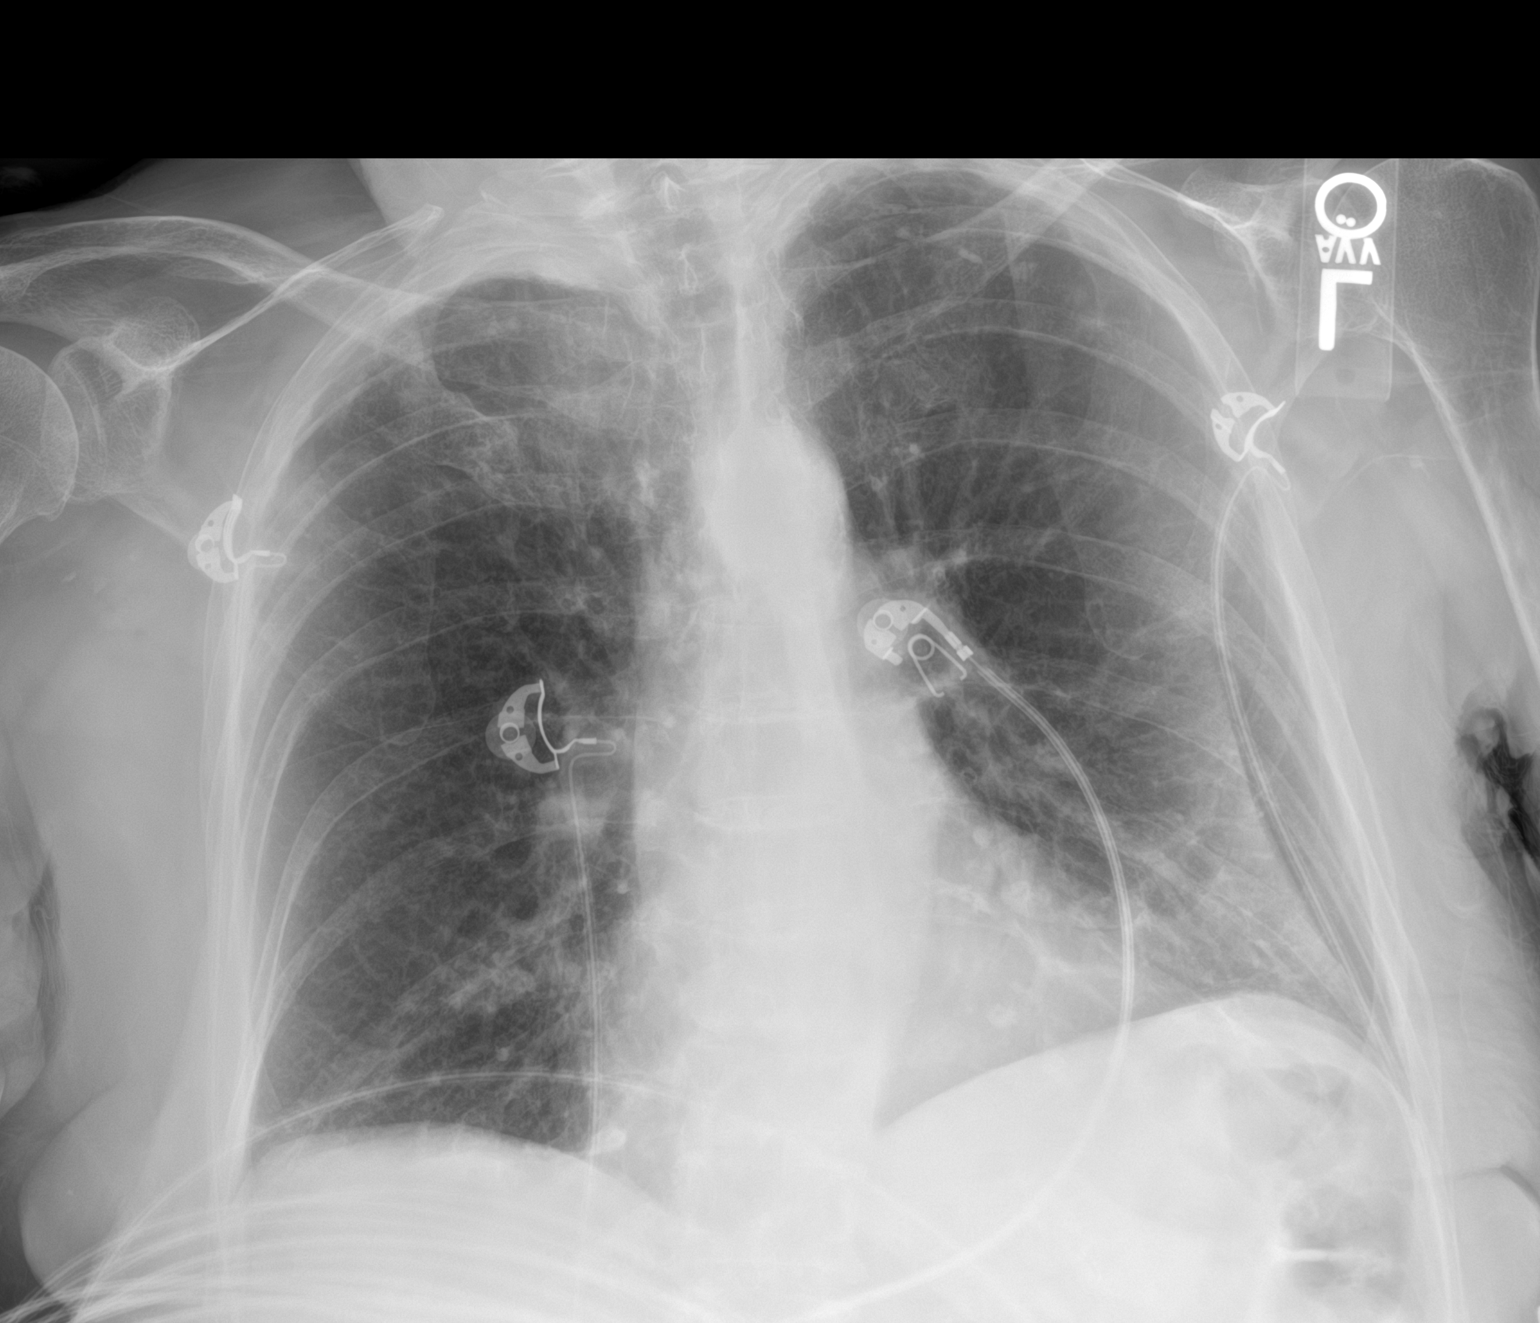

[1 of 1 positions shown; findings below may reference images not displayed]

FINDINGS: Heart size and vascularity normal. Lungs are clear and free of
infiltrate or effusion. Apical pleural scarring bilaterally. No
acute skeletal abnormality.
IMPRESSION: No active disease.

## 2023-12-29 DIAGNOSIS — I10 Essential (primary) hypertension: Secondary | ICD-10-CM | POA: Diagnosis not present

## 2023-12-29 DIAGNOSIS — I4891 Unspecified atrial fibrillation: Secondary | ICD-10-CM | POA: Diagnosis not present

## 2023-12-29 DIAGNOSIS — G459 Transient cerebral ischemic attack, unspecified: Secondary | ICD-10-CM | POA: Diagnosis not present

## 2023-12-29 DIAGNOSIS — R509 Fever, unspecified: Secondary | ICD-10-CM | POA: Diagnosis not present

## 2023-12-29 DIAGNOSIS — Z7901 Long term (current) use of anticoagulants: Secondary | ICD-10-CM | POA: Diagnosis not present

## 2023-12-29 DIAGNOSIS — E039 Hypothyroidism, unspecified: Secondary | ICD-10-CM | POA: Diagnosis not present

## 2023-12-29 DIAGNOSIS — I959 Hypotension, unspecified: Secondary | ICD-10-CM | POA: Diagnosis not present

## 2023-12-29 DIAGNOSIS — R9431 Abnormal electrocardiogram [ECG] [EKG]: Secondary | ICD-10-CM | POA: Diagnosis not present

## 2023-12-29 DIAGNOSIS — I69354 Hemiplegia and hemiparesis following cerebral infarction affecting left non-dominant side: Secondary | ICD-10-CM | POA: Diagnosis not present

## 2023-12-29 DIAGNOSIS — R531 Weakness: Secondary | ICD-10-CM | POA: Diagnosis not present

## 2023-12-29 DIAGNOSIS — I4510 Unspecified right bundle-branch block: Secondary | ICD-10-CM | POA: Diagnosis not present

## 2023-12-29 DIAGNOSIS — I444 Left anterior fascicular block: Secondary | ICD-10-CM | POA: Diagnosis not present

## 2023-12-29 DIAGNOSIS — R29818 Other symptoms and signs involving the nervous system: Secondary | ICD-10-CM | POA: Diagnosis not present

## 2023-12-29 DIAGNOSIS — R569 Unspecified convulsions: Secondary | ICD-10-CM | POA: Diagnosis not present

## 2023-12-29 DIAGNOSIS — Z8673 Personal history of transient ischemic attack (TIA), and cerebral infarction without residual deficits: Secondary | ICD-10-CM | POA: Diagnosis not present

## 2023-12-29 DIAGNOSIS — Z79899 Other long term (current) drug therapy: Secondary | ICD-10-CM | POA: Diagnosis not present

## 2023-12-29 DIAGNOSIS — I482 Chronic atrial fibrillation, unspecified: Secondary | ICD-10-CM | POA: Diagnosis not present

## 2023-12-29 DIAGNOSIS — Z8672 Personal history of thrombophlebitis: Secondary | ICD-10-CM | POA: Diagnosis not present

## 2023-12-29 DIAGNOSIS — R000 Tachycardia, unspecified: Secondary | ICD-10-CM | POA: Diagnosis not present

## 2023-12-30 DIAGNOSIS — I361 Nonrheumatic tricuspid (valve) insufficiency: Secondary | ICD-10-CM | POA: Diagnosis not present

## 2023-12-30 DIAGNOSIS — Z8672 Personal history of thrombophlebitis: Secondary | ICD-10-CM | POA: Diagnosis not present

## 2023-12-30 DIAGNOSIS — G459 Transient cerebral ischemic attack, unspecified: Secondary | ICD-10-CM | POA: Diagnosis not present

## 2023-12-30 DIAGNOSIS — I639 Cerebral infarction, unspecified: Secondary | ICD-10-CM | POA: Diagnosis not present

## 2023-12-30 DIAGNOSIS — I340 Nonrheumatic mitral (valve) insufficiency: Secondary | ICD-10-CM | POA: Diagnosis not present

## 2023-12-30 DIAGNOSIS — I482 Chronic atrial fibrillation, unspecified: Secondary | ICD-10-CM | POA: Diagnosis not present

## 2024-01-27 DIAGNOSIS — Z6820 Body mass index (BMI) 20.0-20.9, adult: Secondary | ICD-10-CM | POA: Diagnosis not present

## 2024-01-27 DIAGNOSIS — M810 Age-related osteoporosis without current pathological fracture: Secondary | ICD-10-CM | POA: Diagnosis not present

## 2024-01-27 DIAGNOSIS — M8589 Other specified disorders of bone density and structure, multiple sites: Secondary | ICD-10-CM | POA: Diagnosis not present

## 2024-01-27 DIAGNOSIS — D539 Nutritional anemia, unspecified: Secondary | ICD-10-CM | POA: Diagnosis not present

## 2024-01-27 DIAGNOSIS — E785 Hyperlipidemia, unspecified: Secondary | ICD-10-CM | POA: Diagnosis not present

## 2024-01-27 DIAGNOSIS — Z1231 Encounter for screening mammogram for malignant neoplasm of breast: Secondary | ICD-10-CM | POA: Diagnosis not present

## 2024-01-27 DIAGNOSIS — I6930 Unspecified sequelae of cerebral infarction: Secondary | ICD-10-CM | POA: Diagnosis not present

## 2024-01-27 DIAGNOSIS — R634 Abnormal weight loss: Secondary | ICD-10-CM | POA: Diagnosis not present

## 2024-01-27 DIAGNOSIS — I4891 Unspecified atrial fibrillation: Secondary | ICD-10-CM | POA: Diagnosis not present

## 2024-01-27 DIAGNOSIS — E039 Hypothyroidism, unspecified: Secondary | ICD-10-CM | POA: Diagnosis not present

## 2024-01-27 DIAGNOSIS — I10 Essential (primary) hypertension: Secondary | ICD-10-CM | POA: Diagnosis not present

## 2024-04-28 DIAGNOSIS — I4891 Unspecified atrial fibrillation: Secondary | ICD-10-CM | POA: Diagnosis not present

## 2024-04-28 DIAGNOSIS — I10 Essential (primary) hypertension: Secondary | ICD-10-CM | POA: Diagnosis not present

## 2024-04-28 DIAGNOSIS — E039 Hypothyroidism, unspecified: Secondary | ICD-10-CM | POA: Diagnosis not present

## 2024-04-28 DIAGNOSIS — R634 Abnormal weight loss: Secondary | ICD-10-CM | POA: Diagnosis not present

## 2024-04-28 DIAGNOSIS — I6930 Unspecified sequelae of cerebral infarction: Secondary | ICD-10-CM | POA: Diagnosis not present

## 2024-04-28 DIAGNOSIS — Z6820 Body mass index (BMI) 20.0-20.9, adult: Secondary | ICD-10-CM | POA: Diagnosis not present

## 2024-04-28 DIAGNOSIS — M810 Age-related osteoporosis without current pathological fracture: Secondary | ICD-10-CM | POA: Diagnosis not present

## 2024-04-28 DIAGNOSIS — E785 Hyperlipidemia, unspecified: Secondary | ICD-10-CM | POA: Diagnosis not present

## 2024-04-28 DIAGNOSIS — D539 Nutritional anemia, unspecified: Secondary | ICD-10-CM | POA: Diagnosis not present

## 2024-08-31 DIAGNOSIS — H539 Unspecified visual disturbance: Secondary | ICD-10-CM | POA: Diagnosis not present

## 2024-08-31 DIAGNOSIS — M810 Age-related osteoporosis without current pathological fracture: Secondary | ICD-10-CM | POA: Diagnosis not present

## 2024-08-31 DIAGNOSIS — E785 Hyperlipidemia, unspecified: Secondary | ICD-10-CM | POA: Diagnosis not present

## 2024-08-31 DIAGNOSIS — I6930 Unspecified sequelae of cerebral infarction: Secondary | ICD-10-CM | POA: Diagnosis not present

## 2024-08-31 DIAGNOSIS — R634 Abnormal weight loss: Secondary | ICD-10-CM | POA: Diagnosis not present

## 2024-08-31 DIAGNOSIS — I10 Essential (primary) hypertension: Secondary | ICD-10-CM | POA: Diagnosis not present

## 2024-08-31 DIAGNOSIS — E039 Hypothyroidism, unspecified: Secondary | ICD-10-CM | POA: Diagnosis not present

## 2024-08-31 DIAGNOSIS — D539 Nutritional anemia, unspecified: Secondary | ICD-10-CM | POA: Diagnosis not present

## 2024-08-31 DIAGNOSIS — Z23 Encounter for immunization: Secondary | ICD-10-CM | POA: Diagnosis not present

## 2024-08-31 DIAGNOSIS — I4891 Unspecified atrial fibrillation: Secondary | ICD-10-CM | POA: Diagnosis not present

## 2024-08-31 DIAGNOSIS — Z681 Body mass index (BMI) 19 or less, adult: Secondary | ICD-10-CM | POA: Diagnosis not present
